# Patient Record
Sex: Female | Born: 1980 | Race: White | Hispanic: No | Marital: Married | Smoking: Never smoker
Health system: Southern US, Community
[De-identification: ages and names within clinical notes are randomized; demographics above are authoritative.]

## PROBLEM LIST (undated history)

## (undated) DIAGNOSIS — N649 Disorder of breast, unspecified: Secondary | ICD-10-CM

## (undated) DIAGNOSIS — Z148 Genetic carrier of other disease: Secondary | ICD-10-CM

## (undated) HISTORY — DX: Genetic carrier of other disease: Z14.8

## (undated) HISTORY — DX: Disorder of breast, unspecified: N64.9

---

## 2002-06-19 ENCOUNTER — Other Ambulatory Visit: Admission: RE | Admit: 2002-06-19 | Discharge: 2002-06-19 | Payer: Self-pay | Admitting: Obstetrics and Gynecology

## 2003-06-24 ENCOUNTER — Other Ambulatory Visit: Admission: RE | Admit: 2003-06-24 | Discharge: 2003-06-24 | Payer: Self-pay | Admitting: Obstetrics and Gynecology

## 2004-08-08 ENCOUNTER — Other Ambulatory Visit: Admission: RE | Admit: 2004-08-08 | Discharge: 2004-08-08 | Payer: Self-pay | Admitting: Obstetrics and Gynecology

## 2005-08-16 ENCOUNTER — Other Ambulatory Visit: Admission: RE | Admit: 2005-08-16 | Discharge: 2005-08-16 | Payer: Self-pay | Admitting: Obstetrics and Gynecology

## 2009-12-24 HISTORY — PX: DILATION AND CURETTAGE OF UTERUS: SHX78

## 2010-07-05 ENCOUNTER — Ambulatory Visit (HOSPITAL_COMMUNITY): Admission: RE | Admit: 2010-07-05 | Discharge: 2010-07-05 | Payer: Self-pay | Admitting: Obstetrics & Gynecology

## 2010-08-16 ENCOUNTER — Ambulatory Visit (HOSPITAL_COMMUNITY)
Admission: RE | Admit: 2010-08-16 | Discharge: 2010-08-16 | Payer: Self-pay | Source: Home / Self Care | Attending: Obstetrics | Admitting: Obstetrics

## 2010-09-07 ENCOUNTER — Encounter
Admission: RE | Admit: 2010-09-07 | Discharge: 2010-09-27 | Payer: Self-pay | Source: Home / Self Care | Attending: Obstetrics | Admitting: Obstetrics

## 2010-09-18 ENCOUNTER — Encounter: Payer: Self-pay | Admitting: Obstetrics

## 2010-10-08 ENCOUNTER — Inpatient Hospital Stay (HOSPITAL_COMMUNITY)
Admission: AD | Admit: 2010-10-08 | Discharge: 2010-10-11 | DRG: 765 | Disposition: A | Discharge status: Home / Self Care | Payer: PRIVATE HEALTH INSURANCE | Source: Ambulatory Visit | Attending: Obstetrics | Admitting: Obstetrics

## 2010-10-08 ENCOUNTER — Inpatient Hospital Stay (HOSPITAL_COMMUNITY): Payer: PRIVATE HEALTH INSURANCE

## 2010-10-08 ENCOUNTER — Other Ambulatory Visit: Payer: Self-pay | Admitting: Obstetrics

## 2010-10-08 DIAGNOSIS — O1414 Severe pre-eclampsia complicating childbirth: Principal | ICD-10-CM | POA: Diagnosis present

## 2010-10-08 DIAGNOSIS — O34219 Maternal care for unspecified type scar from previous cesarean delivery: Secondary | ICD-10-CM | POA: Diagnosis present

## 2010-10-08 DIAGNOSIS — O99814 Abnormal glucose complicating childbirth: Secondary | ICD-10-CM | POA: Diagnosis present

## 2010-10-08 DIAGNOSIS — O4590 Premature separation of placenta, unspecified, unspecified trimester: Secondary | ICD-10-CM | POA: Diagnosis present

## 2010-10-08 LAB — CBC
HCT: 36.4 % (ref 36.0–46.0)
Hemoglobin: 12.4 g/dL (ref 12.0–15.0)
RBC: 4.1 MIL/uL (ref 3.87–5.11)
WBC: 13 10*3/uL — ABNORMAL HIGH (ref 4.0–10.5)

## 2010-10-08 LAB — PROTEIN / CREATININE RATIO, URINE
Creatinine, Urine: 16.7 mg/dL
Total Protein, Urine: <6 mg/dL

## 2010-10-08 LAB — LACTATE DEHYDROGENASE: LDH: 200 U/L (ref 94–250)

## 2010-10-08 LAB — COMPREHENSIVE METABOLIC PANEL
Alkaline Phosphatase: 112 U/L (ref 39–117)
BUN: 11 mg/dL (ref 6–23)
Calcium: 8.6 mg/dL (ref 8.4–10.5)
Glucose, Bld: 155 mg/dL — ABNORMAL HIGH (ref 70–99)
Potassium: 3.7 mEq/L (ref 3.5–5.1)
Total Protein: 5.9 g/dL — ABNORMAL LOW (ref 6.0–8.3)

## 2010-10-08 LAB — URIC ACID: Uric Acid, Serum: 6.8 mg/dL (ref 2.4–7.0)

## 2010-10-09 LAB — COMPREHENSIVE METABOLIC PANEL
Albumin: 2 g/dL — ABNORMAL LOW (ref 3.5–5.2)
Alkaline Phosphatase: 79 U/L (ref 39–117)
BUN: 6 mg/dL (ref 6–23)
CO2: 21 mEq/L (ref 19–32)
Chloride: 106 mEq/L (ref 96–112)
GFR calc non Af Amer: >60 mL/min (ref >60)
Glucose, Bld: 92 mg/dL (ref 70–99)
Potassium: 3.5 mEq/L (ref 3.5–5.1)
Total Bilirubin: 0.6 mg/dL (ref 0.3–1.2)

## 2010-10-09 LAB — CBC
HCT: 31.5 % — ABNORMAL LOW (ref 36.0–46.0)
Hemoglobin: 10.7 g/dL — ABNORMAL LOW (ref 12.0–15.0)
MCH: 30.3 pg (ref 26.0–34.0)
MCV: 89.2 fL (ref 78.0–100.0)
RBC: 3.53 MIL/uL — ABNORMAL LOW (ref 3.87–5.11)
WBC: 10.9 10*3/uL — ABNORMAL HIGH (ref 4.0–10.5)

## 2010-10-09 LAB — DIC (DISSEMINATED INTRAVASCULAR COAGULATION)PANEL
Fibrinogen: 552 mg/dL — ABNORMAL HIGH (ref 204–475)
INR: 0.99 (ref 0.00–1.49)
Platelets: 97 10*3/uL — ABNORMAL LOW (ref 150–400)
Prothrombin Time: 13.3 seconds (ref 11.6–15.2)

## 2010-10-09 LAB — MAGNESIUM: Magnesium: 4 mg/dL — ABNORMAL HIGH (ref 1.5–2.5)

## 2010-10-09 LAB — URIC ACID: Uric Acid, Serum: 6.5 mg/dL (ref 2.4–7.0)

## 2010-10-10 LAB — CBC
Hemoglobin: 10.3 g/dL — ABNORMAL LOW (ref 12.0–15.0)
MCH: 30.6 pg (ref 26.0–34.0)
MCHC: 33.9 g/dL (ref 30.0–36.0)
RDW: 12.9 % (ref 11.5–15.5)

## 2010-10-11 LAB — TYPE AND SCREEN

## 2010-11-04 ENCOUNTER — Ambulatory Visit (HOSPITAL_COMMUNITY)
Admission: RE | Admit: 2010-11-04 | Discharge: 2010-11-04 | Disposition: A | Discharge status: Home / Self Care | Payer: PRIVATE HEALTH INSURANCE | Source: Ambulatory Visit | Attending: Obstetrics | Admitting: Obstetrics

## 2010-11-04 DIAGNOSIS — O9279 Other disorders of lactation: Secondary | ICD-10-CM | POA: Insufficient documentation

## 2010-11-04 NOTE — Op Note (Signed)
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  POSTOPERATIVE DIAGNOSES:  A 35 weeks intrauterine pregnancy hemolysis, elevated liver enzymes, low platelets syndrome suspected placental abruption, prior cesarean section, plus confirmed placental abruption.  PROCEDURE:  Repeat cesarean section.  SURGEON:  Lendon Colonel, MD  ASSISTANT:  Arlan Organ, MD  ANESTHESIA:  Spinal.  FINDINGS:  Normal uterus, tubes and abruption of approximately one-sixth of the placental surface with a large retroplacental clot.  Evidence of placental abruption in the right cornual region.  Female infant in the DOA position, weight 5 pounds 13 ounces.  Apgars 9 and 9.  The cord pH was 7.24.  SPECIMENS:  Cord pH and placenta to Pathology.  ANTIBIOTICS:  1 g Ancef.  ESTIMATED BLOOD LOSS:  800 mL.  COMPLICATIONS:  None.  INDICATIONS:  This is a 30 year old G3, P0-1-1 at 35 weeks and 4 days who presents with vaginal bleeding and general feeling of "not right" and was found to have thrombocytopenia, elevated liver enzymes and moderate amount of vaginal bleeding with contractions.  Discussion was had with the patient and consulting maternal fetal medicine specialist who agreed to proceed with repeat cesarean section at this time.  DESCRIPTION OF PROCEDURE:  After informed consent was obtained, the patient was taken to the operating room where spinal anesthesia was initially without difficulty.  She was prepped and draped in the  normal sterile fashion in dorsal supine position with a leftward tilt.  A Pfannenstiel skin was made with the scalpel over the old Pfannenstiel scar.  This was carried through to the underlying layer of fascia with Bovie cautery.  There was quite a bit of scar in the subcu tissue.  The fascia was finally found and transected with the combination of the Mayo scissors and the Bovie cautery.  Please note, there was some muscle tissue in the fascia.  There was also some peritoneal tissue protruding through the fascia.  This was carefully dissected around.  The peritoneum was identified and carefully entered.  Peritoneal incision was extended superiorly and inferiorly with good visualization of bladder.  Additional fascial dissection was done both sharply and with the Bovie cautery.  Several bleeding vessels were cauterized with Bovie. Bladder blade was inserted.  The vesicouterine peritoneum was identified, grasped with pickups, entered sharply.  The bladder flap was extended sharply.  Bladder blade was replaced after palpation of the uterus, identification of the major vessels.  Lower uterine segment was incised in transverse fashion with the scalpel.  Once reaching the amniotic sac, the bandage scissors used to extend the uterine incision laterally and superiorly.  The amniotic sac was ruptured, the infant's occiput was grasped, brought to the incision.  The infant was delivered without complication.  Nose and mouth were bulb suctioned.  The remainder of the infant was delivered.  Cord was clamped and cut.  The infant was handed off to awaiting pediatrician.  Cord gas and cord blood were obtained.  The placenta was expressed. At this point bruising and evidence of abruption were noted on the uterus and a large retroplacental clot was noted.  Uterus was exteriorized, cleared of all clots and debris.  Uterine incision was repaired with 0 Vicryl in running locked fashion.  A second layer of same  suture was used in imbricating fashion.  Additional figure-of-eight was placed in the midpoint of the incision.  The uterus was returned to the abdomen. The incision was found to be hemostatic.  The gutters were cleared of all clots and debris.  The cut muscle edges and the underside of fascia were inspected.  There was a small amount of bleeding at the edge of the bladder.  This was controlled with very cautious cautery.  Given the amount of scar on the entry into the abdomen.  The bladder was back filled to about 300 mL with sterile milk.  All edges of bladder were seen and found to be well defined.  No breaks and bladder integrity was noted.  Bladder was redrained.  The peritoneum was closed with 2-0 Vicryl in a running fashion.  The fascia was closed with 0 Vicryl in a running fashion.  Scarpa's layer was closed and the skin was closed with staples.  The patient tolerated procedure well.  Sponge, lap, and needle counts were correct x3.  The patient was taken to recovery room in stable condition.     Lendon Colonel, MD     KAF/MEDQ  D:  10/08/2010  T:  10/09/2010  Job:  [id number]  Electronically Signed by Noland Fordyce MD on 11/03/2010 07:34:14 PM

## 2010-11-08 ENCOUNTER — Inpatient Hospital Stay (HOSPITAL_COMMUNITY): Admission: AD | Admit: 2010-11-08 | Payer: Self-pay | Admitting: Obstetrics and Gynecology

## 2010-11-22 ENCOUNTER — Other Ambulatory Visit: Payer: Self-pay | Admitting: Obstetrics

## 2010-12-16 NOTE — Discharge Summary (Signed)
Tiffany Fleming, Tiffany Fleming            ACCOUNT NO.:  [id number]  MEDICAL RECORD NO.:  [id number]           PATIENT TYPE:  I  LOCATION:  9310                          [facility]ILITY:  [facility]  PHYSICIAN:  Lendon Colonel, MD   DATE OF BIRTH:  Jun 20, 1981  DATE OF ADMISSION:  10/08/2010 DATE OF DISCHARGE:  10/11/2010                              DISCHARGE SUMMARY   ADMITTING DIAGNOSES:  35 weeks' gestation, elevated blood pressure, vaginal bleeding concern for placental abruption, previous cesarean section.  DISCHARGE DIAGNOSES:  Postoperative day #3, status post cesarean section, preeclampsia with HELLP syndrome marginal placental abruption.  PATIENT HISTORY:  The patient is a gravida 3, para 0-1-1-1, at 35 weeks and 4 days with [facility] at November 08, 2010.  Prenatal care at [facility], at [redacted] weeks gestation,  Dr. Ernestina Penna.Marland Kitchen  PRENATAL LABORATORY DATA:  Blood type and Rh O positive, rubella positive, HIV negative, RPR negative, hepatitis B negative.  GTT 3-hour abnormal with diagnosis of gestational diabetes.  Prenatal course was complicated with a history of cesarean section, history of preeclampsia, current gestational diabetes A2, on glyburide and a G mutation, MTHFR.  MEDICAL/SURGICAL HISTORY:  Significant for C-section x1 and TAB with Trisomy 21.  CURRENT MEDICATIONS: 1. Prenatal vitamin 1 tablet daily. 2. Folic acid 1 mg p.o. daily. 3. Baby aspirin. 4. Glyburide 2.5 mg p.o. at supper.  PRESENTATION:  The patient presents with onset of vaginal bleeding and general malaise.  Blood pressure at the time of admission is 155/85.  Admission CBC white blood cell count 13, hemoglobin 12.4, hematocrit 36.1, and platelet count of 103.  D-dimer is positive at 2.1 with fibronigen 552. SGOT is 36, SGPT of 35, and uric acid of 6.8.  PHYSICAL EXAMINATION:  Cervical exam:  Patient is a fingertip, thick and high with  noted dark red vaginal bleeding on exam.  On sonogram, vertex presentation.   AFI 13.  Estimated fetal weight at 67 percentile with a reactive NST.  CONSULTATIONS:  MFM, Dr. Sherrie George with recommendation for cesarean delivery and magnesium sulfate prophylaxis.  PROCEDURE:  The patient will underwent cesarean section by Dr. Ernestina Penna. Female newborn weight 5pounds 13ounces transfered to NICU. Placental abruption confirmed intraoperatively. See operative report for further details.  POSTOPERATIVE COURSE:  The patient's postoperative course was uneventful, improving HELLP syndrome and resolving preeclampsia. Magnesium sulfate prophylaxis x24 hours postop with positive diuresis on postoperative day #2.  At the time of discharge, patient is net negative 300, blood pressure range 103-120/71-84.  Postoperative CBC on October 10, 2010, white blood cell count 10.2, hemoglobin 10.3, hematocrit 30.4, and platelet count of 93.  Repeat labs revealed stable platelet count at time of discharge.  The patient is discharged home on postoperative day #3.  Physical exam was within normal limits.  The patient was afebrile during the course of hospitalization.  Wound edges were well approximated with staples.  No erythema, no ecchymosis, no drainage.  DISCHARGE CARE:  The patient is discharged home, to return to [facility] in 4 days for staple removal and blood pressure check.  PIH precautions were reviewed with the patient with instructions to call.  DIET:  Regular.  ACTIVITY:  Postoperative restrictions x2 weeks.  POSTPARTUM INSTRUCTIONS:  Per WOB booklet.  MEDICATIONS: 1. Prenatal vitamin 1 tablet p.o. daily. 2. Ibuprofen 800 mg every 8 hours as needed for discomfort. 3. Percocet 1-2 tablets every 4 hours as needed for pain.     Marlinda Mike, C.N.M.   ______________________________ Lendon Colonel, MD    TB/MEDQ  D:  11/13/2010  T:  11/14/2010  Job:  [id number]  Electronically Signed by Marlinda Mike C.N.M. on 11/16/2010 12:36:35 PM Electronically Signed by Noland Fordyce MD on 12/16/2010 07:03:42 AM

## 2012-03-15 IMAGING — US US OB DETAIL+14 WK
1 series · 18 of 28 positions shown · non-contrast
Comparison: none

OBSTETRICAL ULTRASOUND:
 This ultrasound was performed in The [HOSPITAL], and the [facility] report will be stored to [REDACTED] PACS.  This report is also available in [HOSPITAL]?s accessANYware.

[Series 1: us ob detail+14 wk · 18 of 109 slices shown]
[im 1/109]
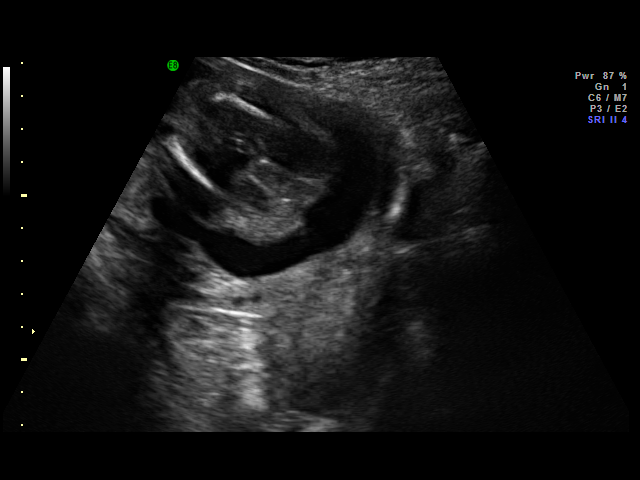
[im 9/109]
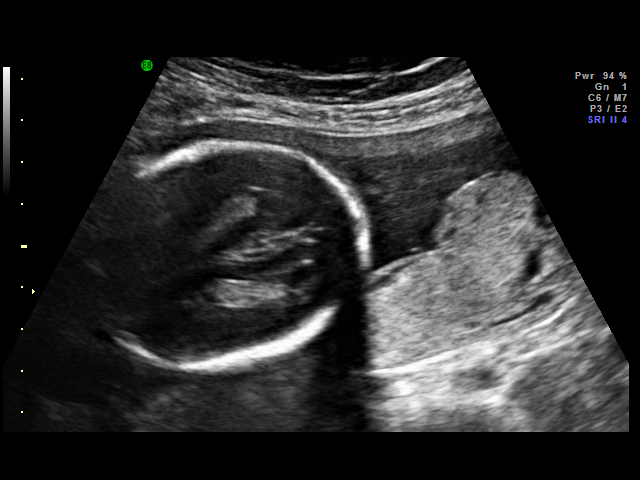
[im 13/109]
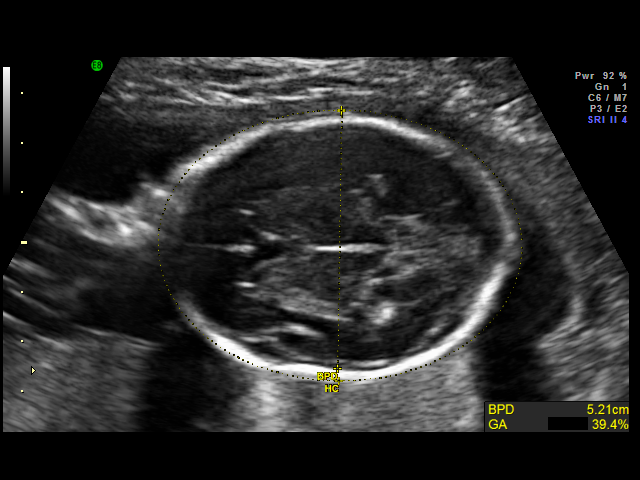
[im 21/109]
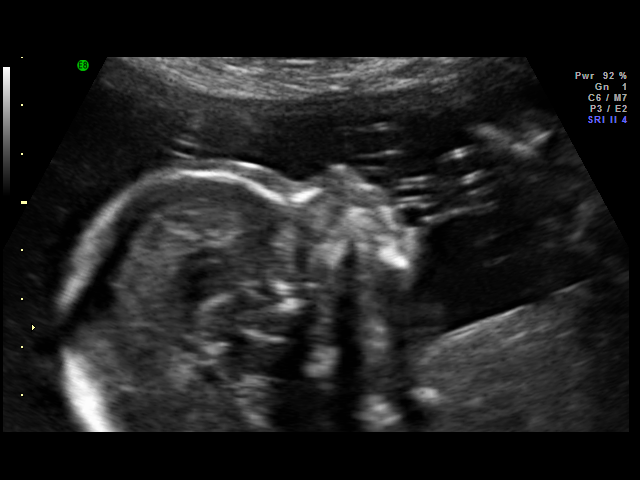
[im 29/109]
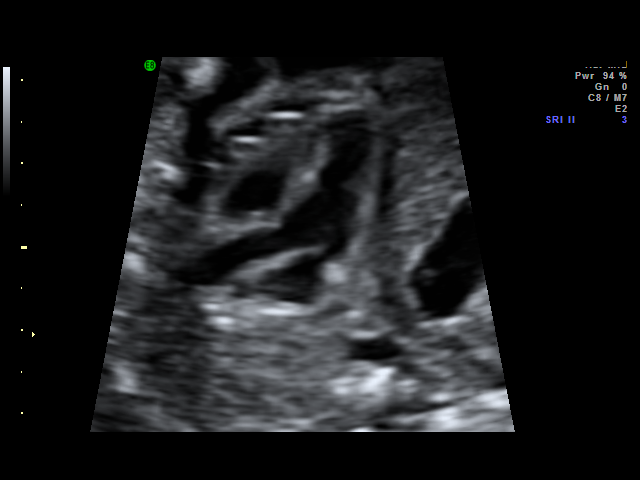
[im 33/109]
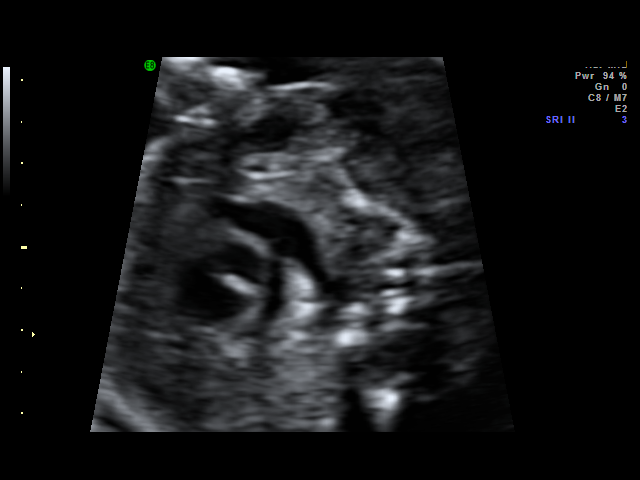
[im 41/109]
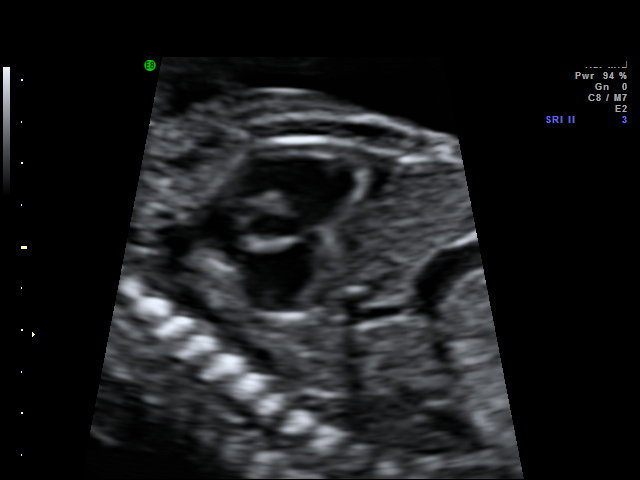
[im 45/109]
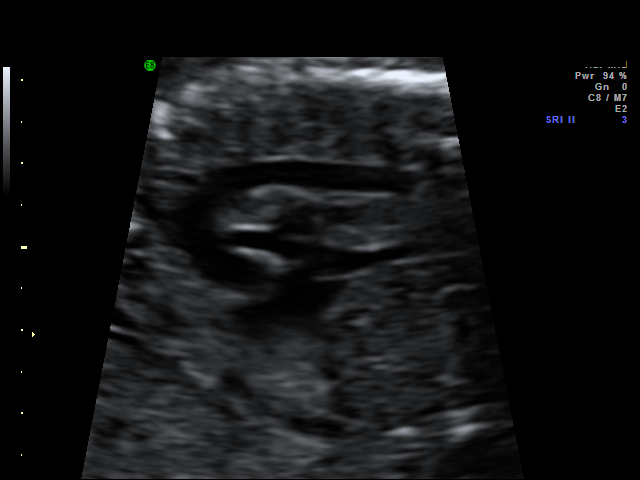
[im 53/109]
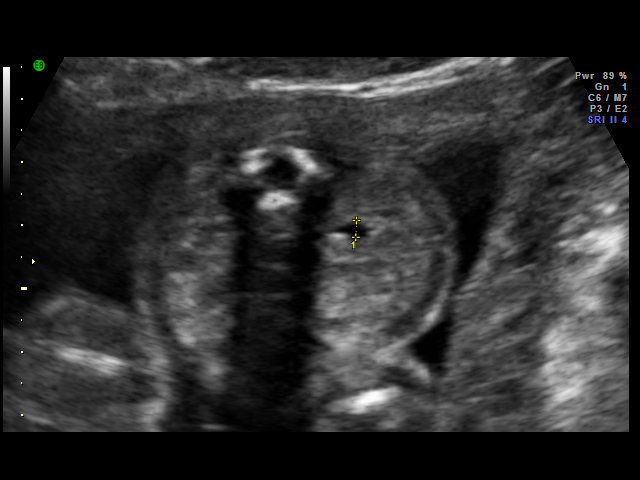
[im 57/109]
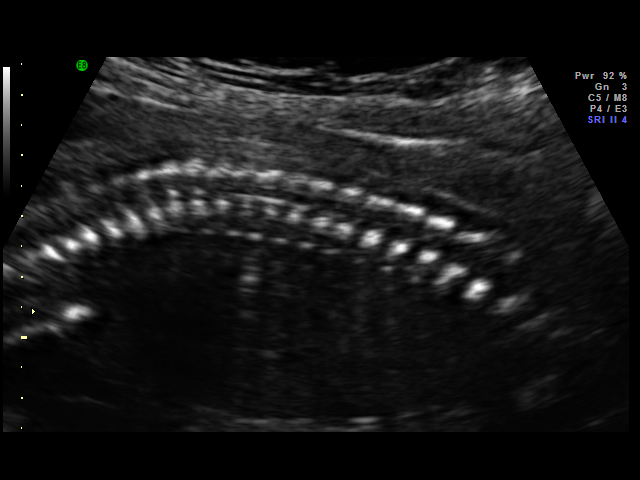
[im 65/109]
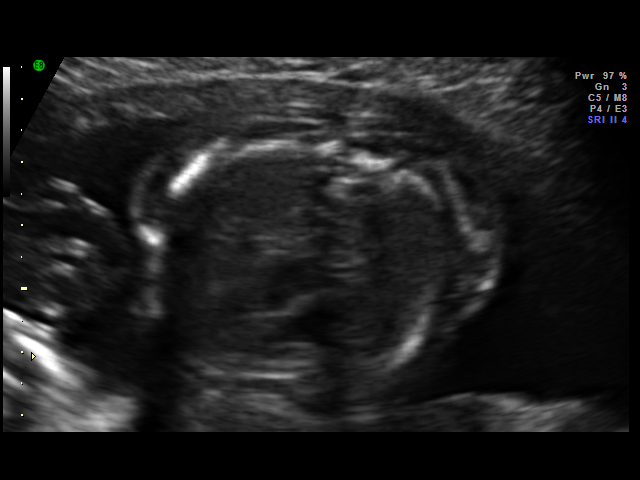
[im 69/109]
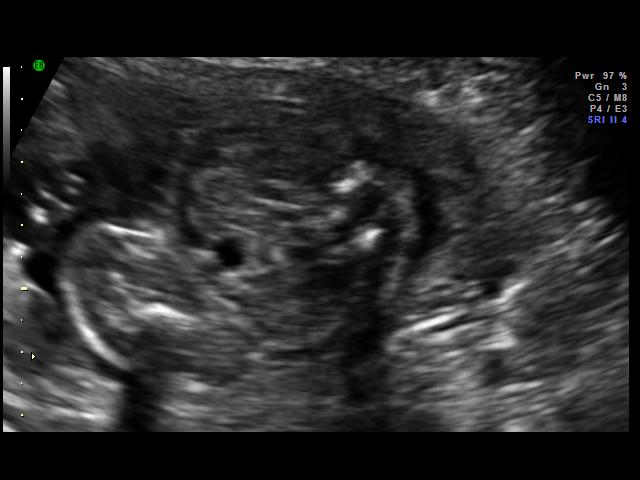
[im 77/109]
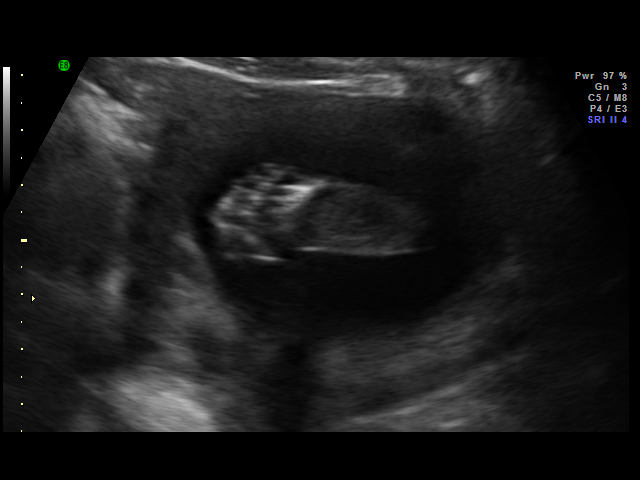
[im 85/109]
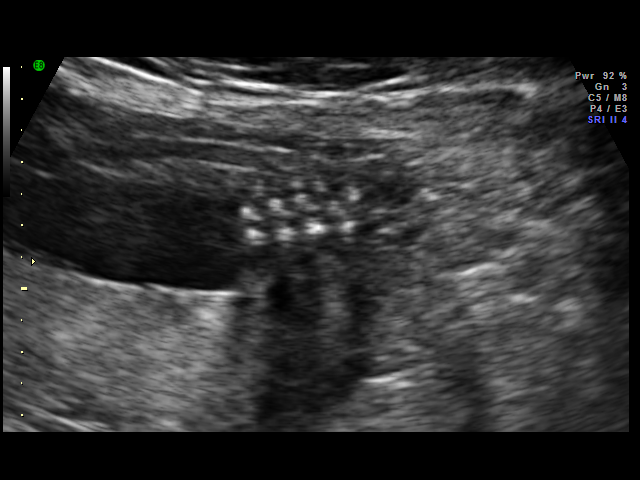
[im 89/109]
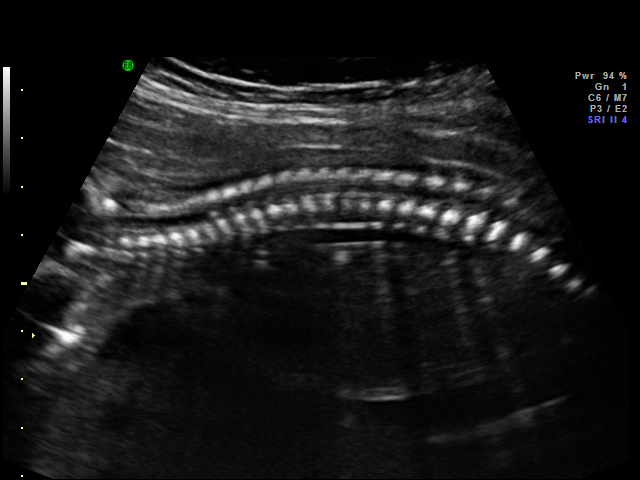
[im 97/109]
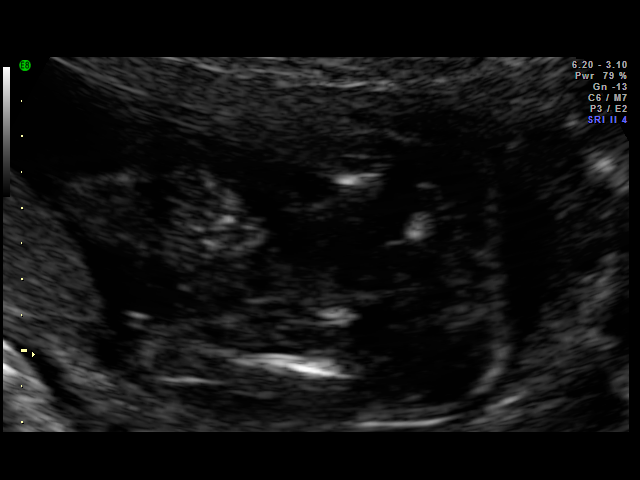
[im 101/109]
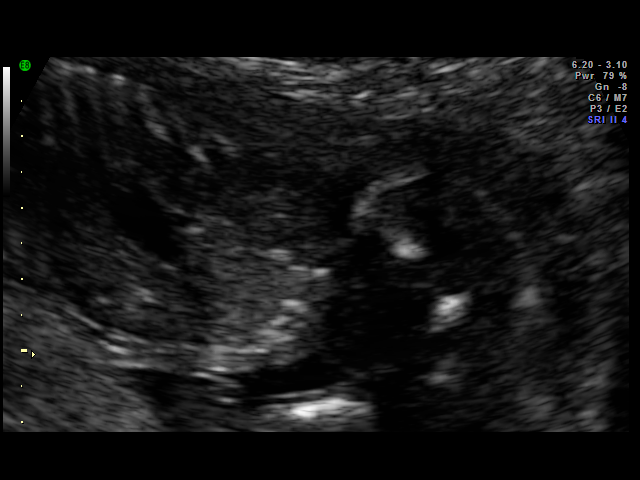
[im 109/109]
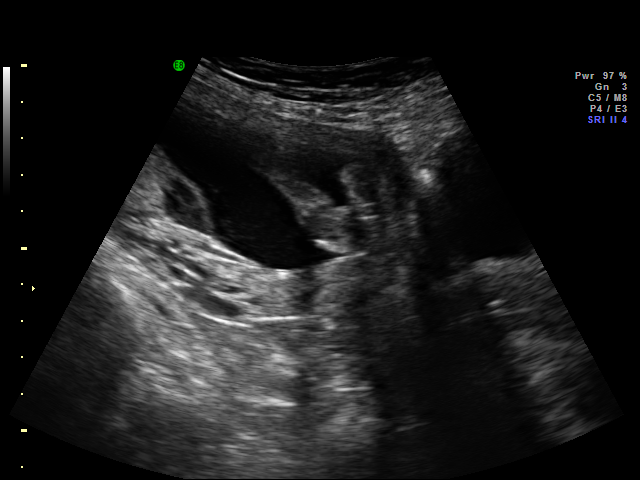

[18 of 28 positions shown; findings below may reference images not displayed]

IMPRESSION: [facility] has also been faxed to the ordering physician.

## 2012-04-26 IMAGING — US US OB FOLLOW-UP
1 series · 18 of 28 positions shown · non-contrast
Comparison: none

[Series 1: us ob follow-up · 18 of 40 slices shown]
[im 1/40]
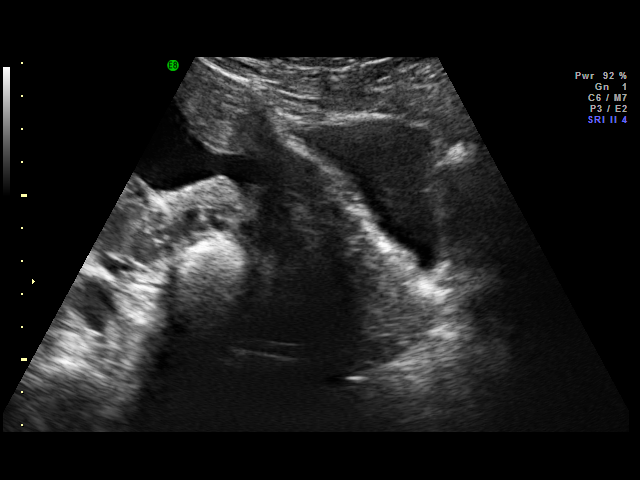
[im 3/40]
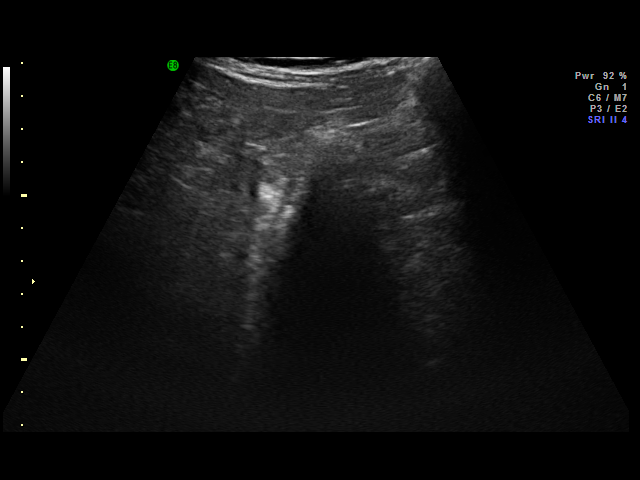
[im 5/40]
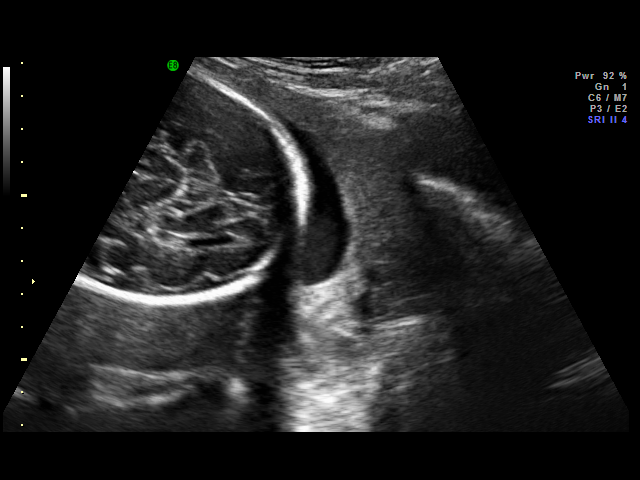
[im 8/40]
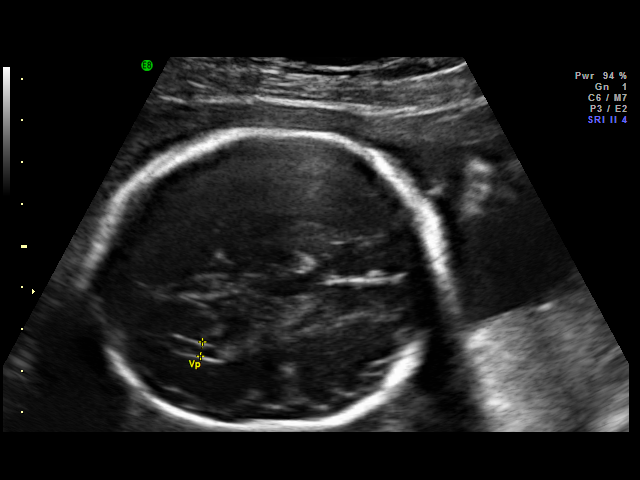
[im 11/40]
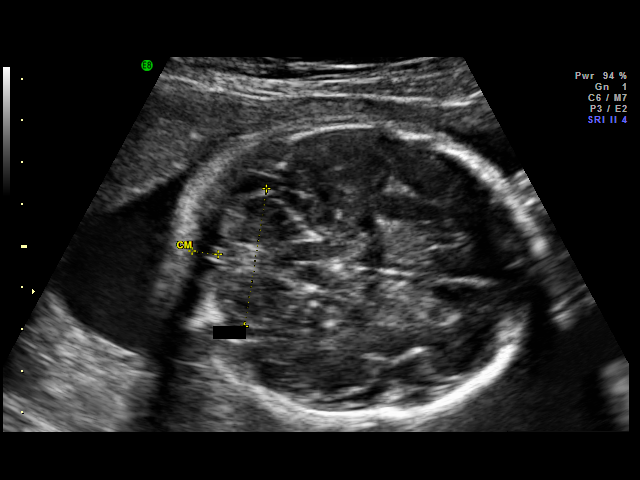
[im 12/40]
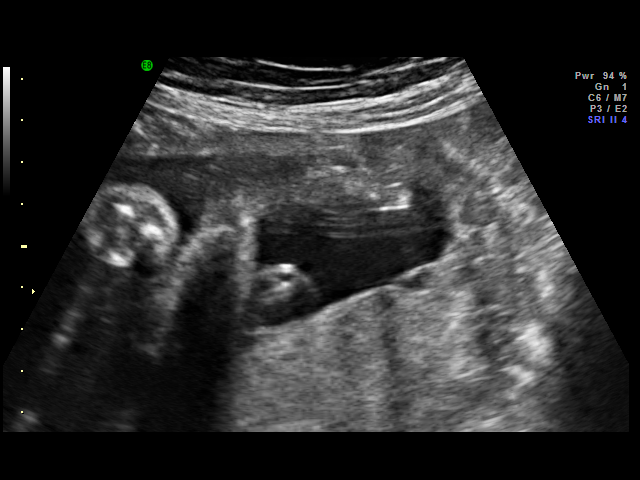
[im 15/40]
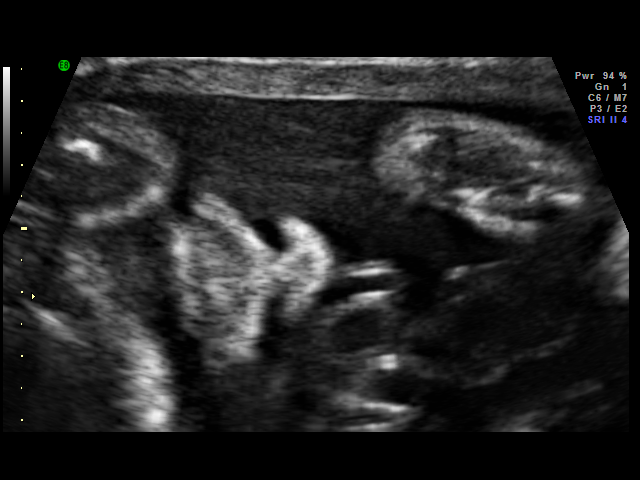
[im 16/40]
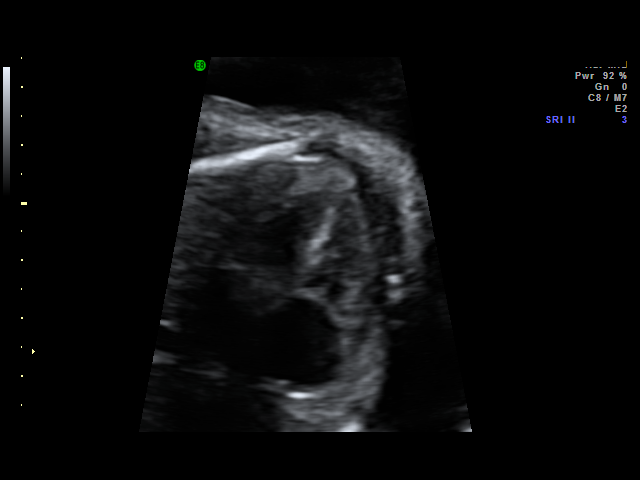
[im 19/40]
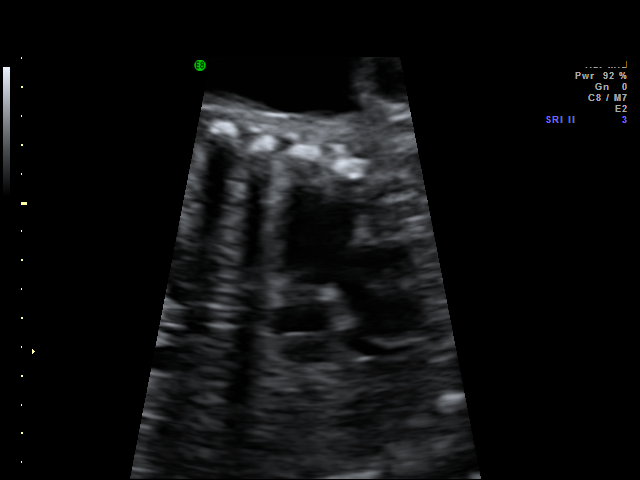
[im 21/40]
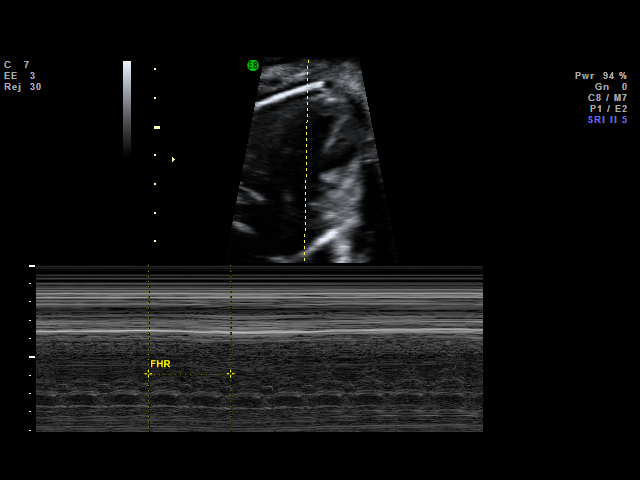
[im 24/40]
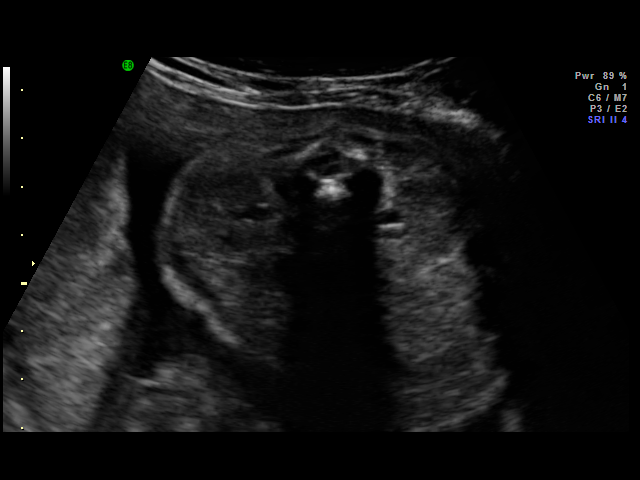
[im 25/40]
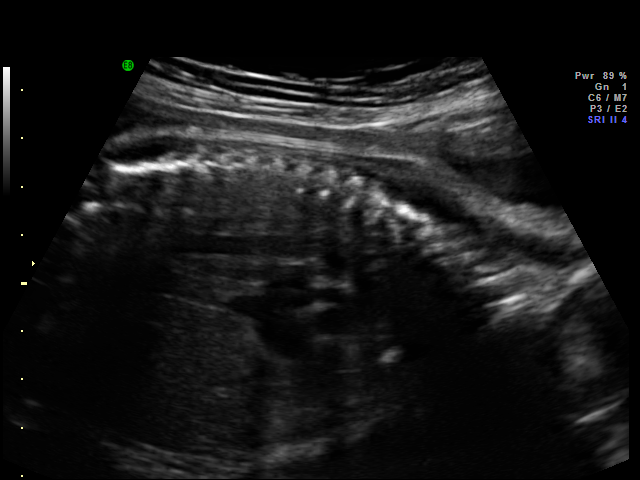
[im 28/40]
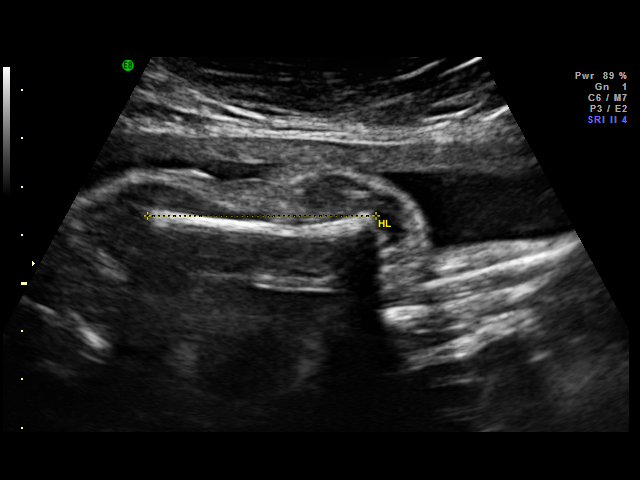
[im 31/40]
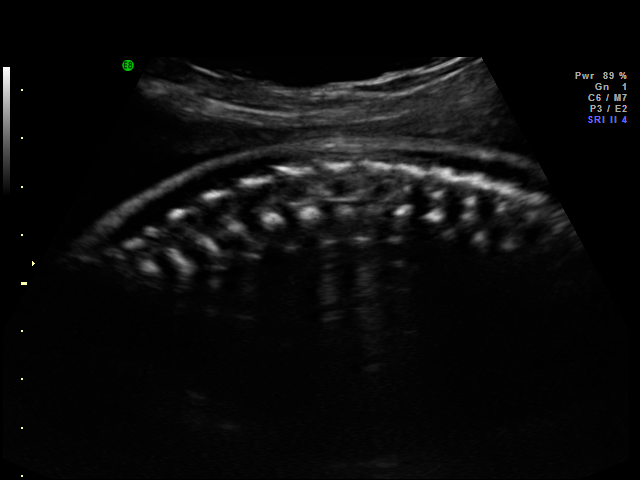
[im 32/40]
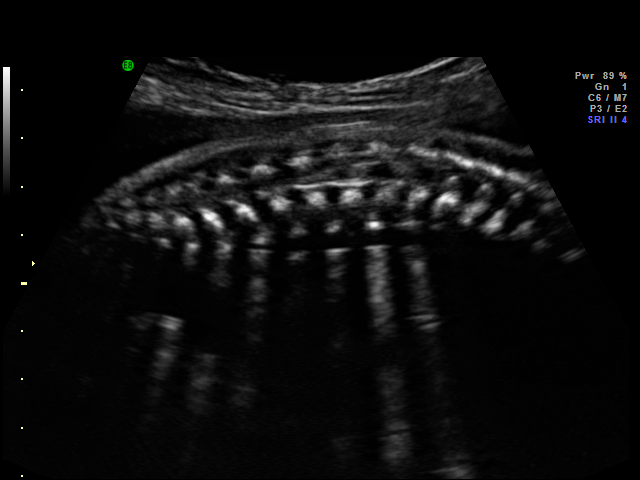
[im 35/40]
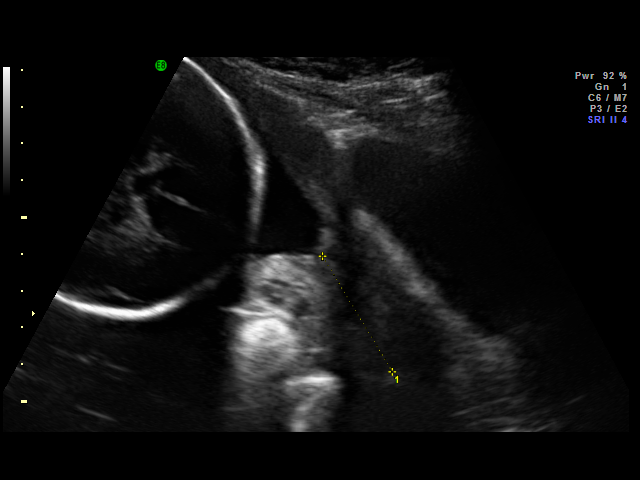
[im 37/40]
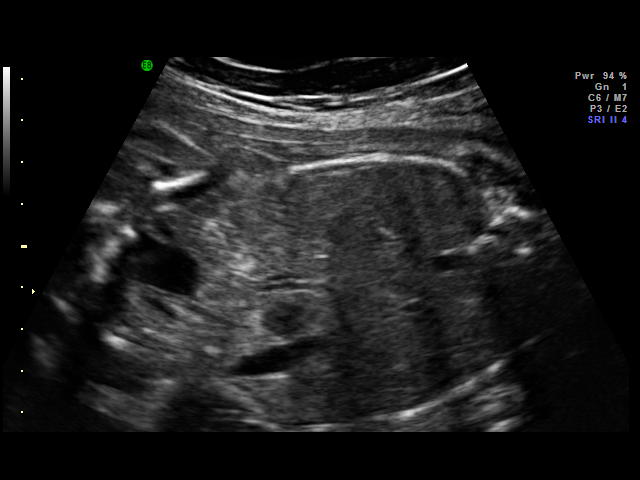
[im 40/40]
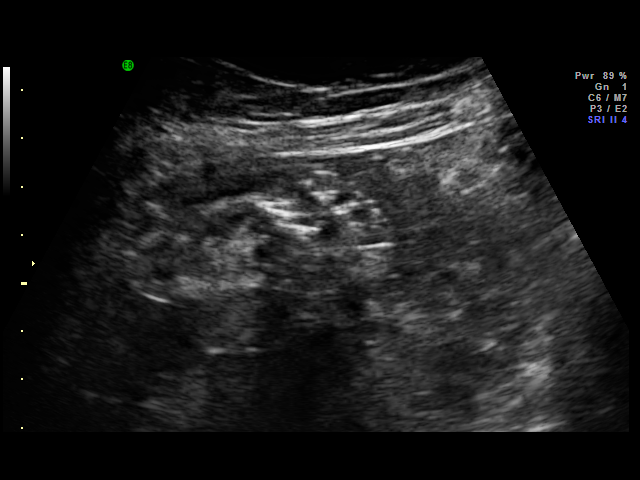

[18 of 28 positions shown; findings below may reference images not displayed]

Canned report from images found in remote index.

Refer to host system for actual result text.

## 2012-06-18 IMAGING — US US OB FOLLOW-UP
1 series · 12 of 28 positions shown · non-contrast
Comparison: none

[Series 1: us ob comp +14 wk · 12 of 39 slices shown]
[im 2/39]
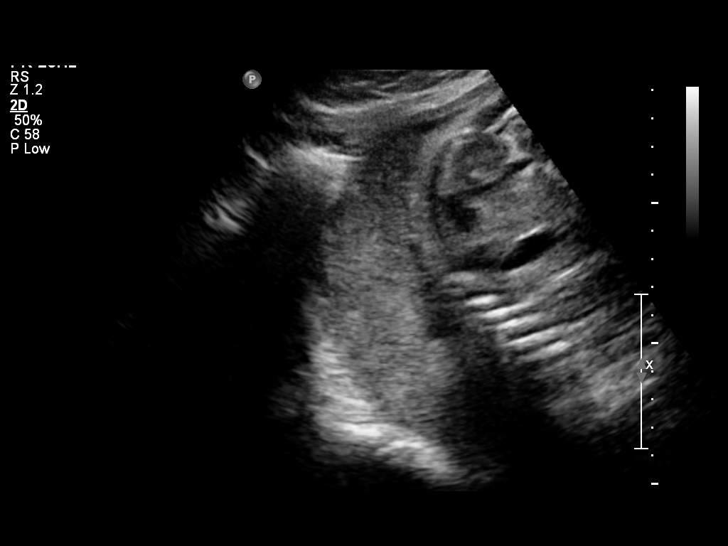
[im 5/39]
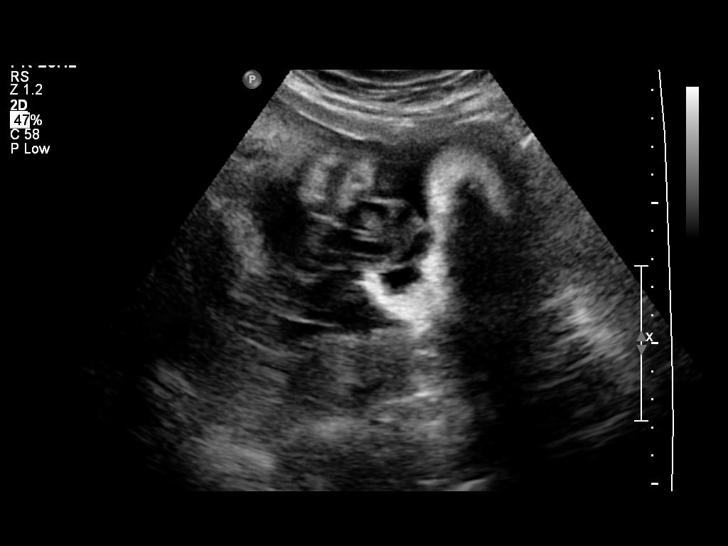
[im 8/39]
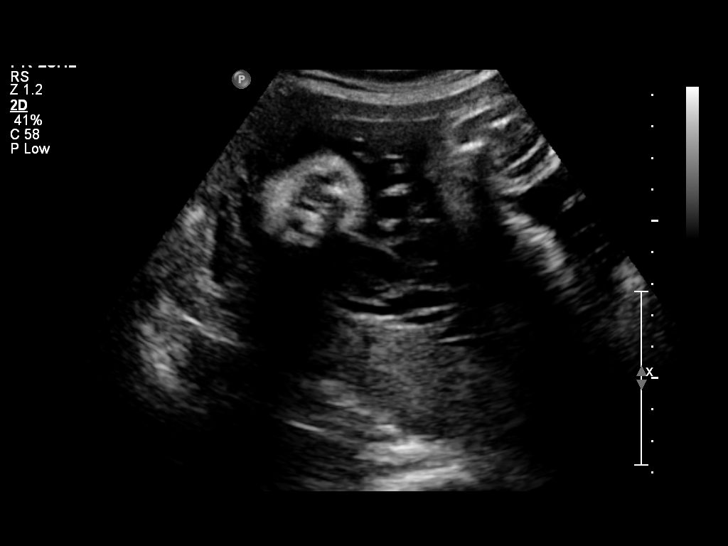
[im 12/39]
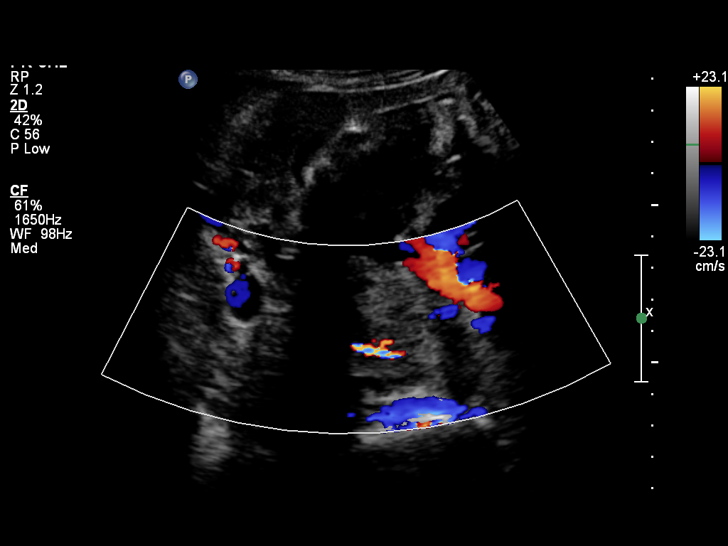
[im 15/39]
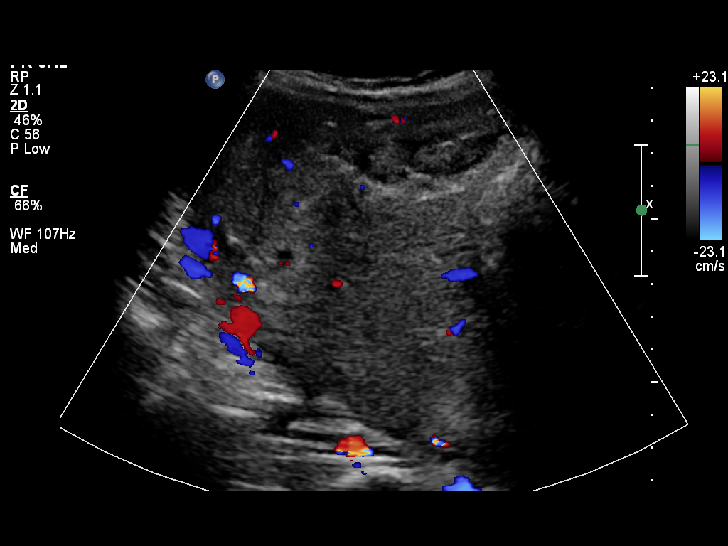
[im 17/39]
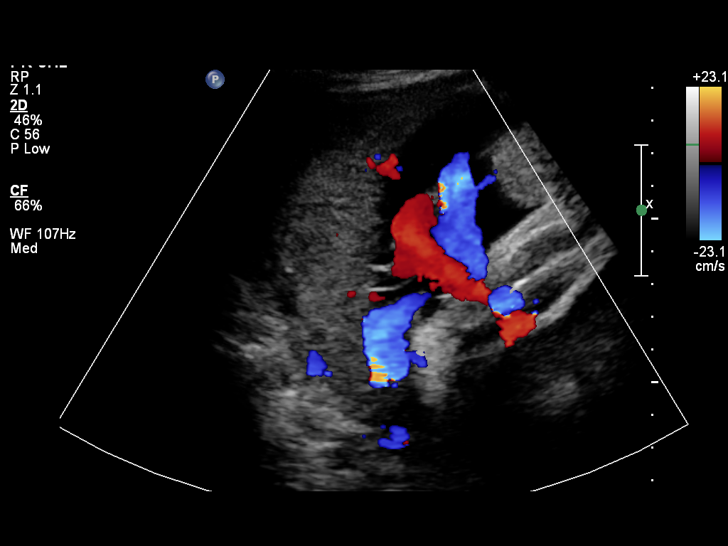
[im 22/39]
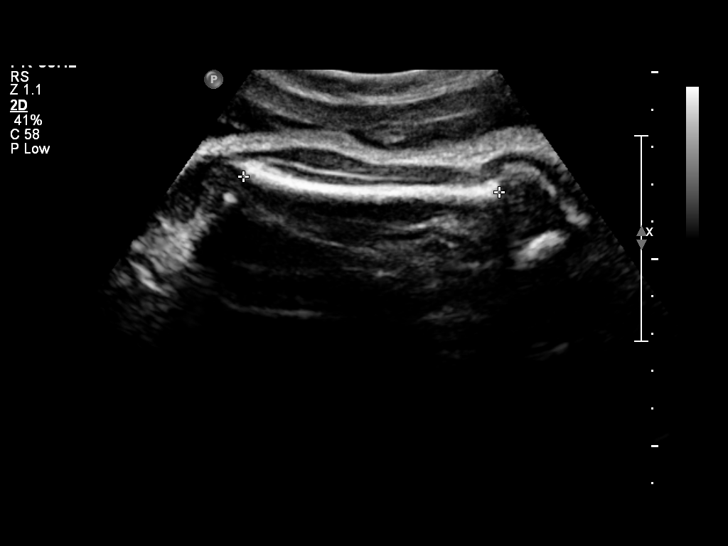
[im 24/39]
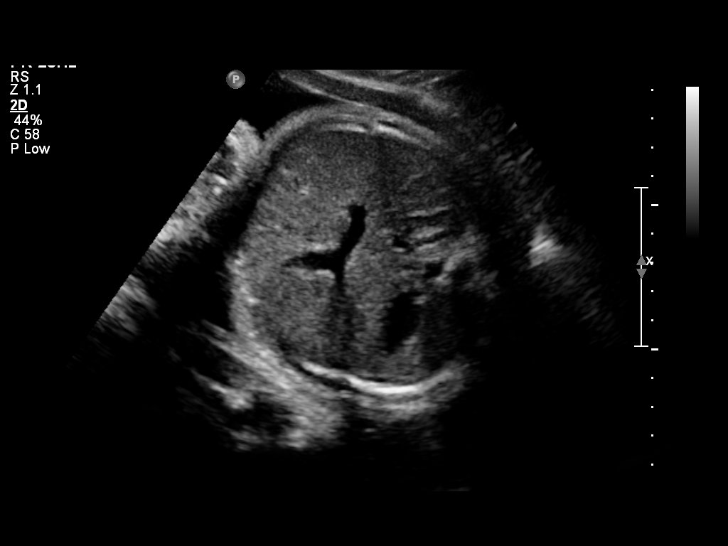
[im 27/39]
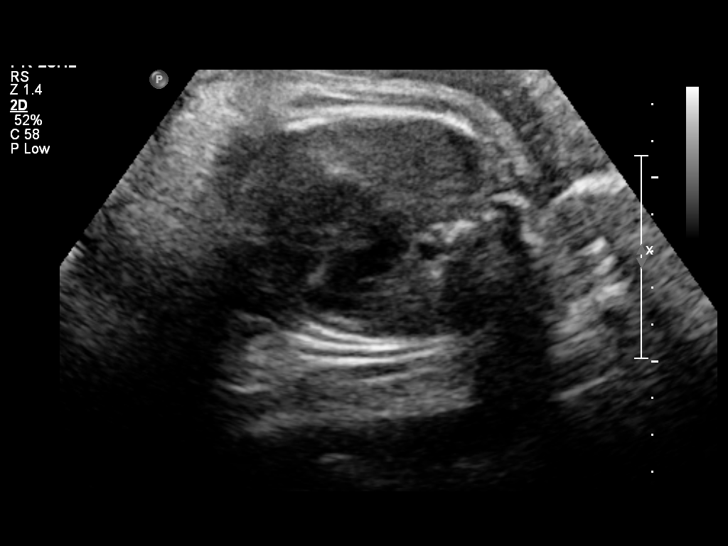
[im 31/39]
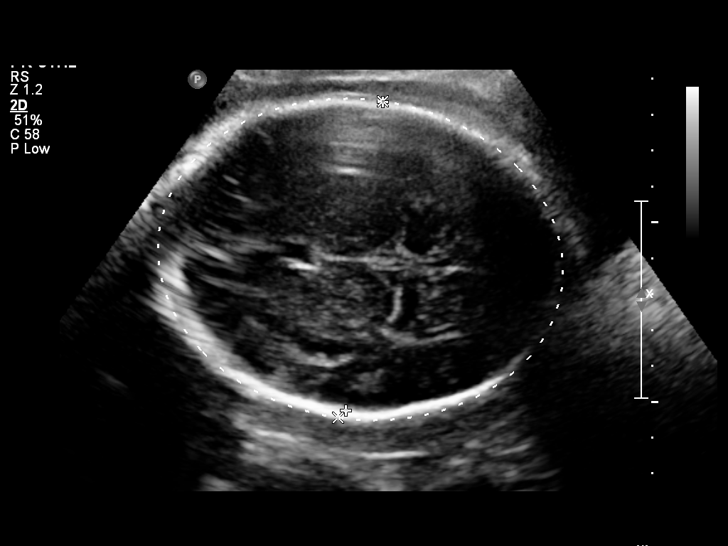
[im 34/39]
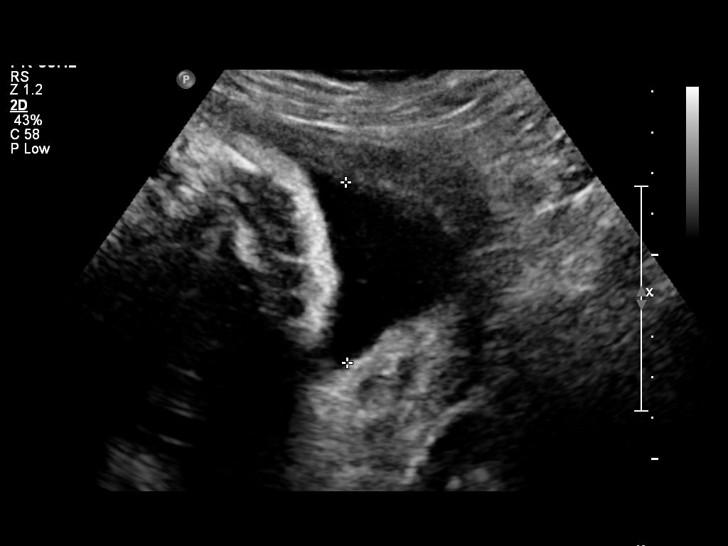
[im 37/39]
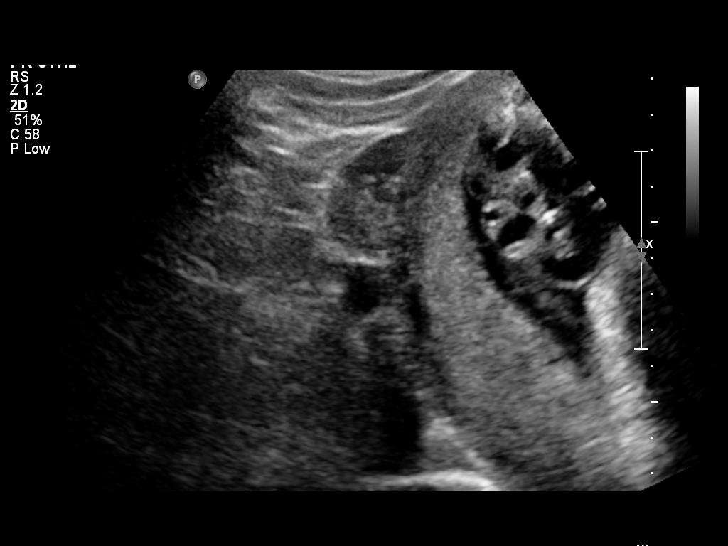

[12 of 28 positions shown; findings below may reference images not displayed]

OBSTETRICS REPORT
                      (Signed Final 10/08/2010 [DATE])

Procedures

 US OB FOLLOW UP                                       76816.1
Indications

 Prior history of preeclampsia to require delivery at
 34 weeks gestaiton
 Prior cesarean section
 Ecogenic fetal bowel
 Vaginal bleeding, unknown etiology
 Assess Fetal Growth / Estimated Fetal Weight
 Assess placenta
Fetal Evaluation

 Fetal Heart Rate:  155                          bpm
 Cardiac Activity:  Observed
 Presentation:      Cephalic
 Placenta:          Posterior, above cervical
                    os
 P. Cord            Previously seen
 Insertion:

 Comment:    No placental abruption or previa identified.

 Amniotic Fluid
 AFI FV:      Subjectively within normal limits
 AFI Sum:     13.62   cm       48  %Tile     Larg Pckt:    5.46  cm
 RUQ:   5.46    cm   LUQ:    3.76   cm    LLQ:   4.4     cm
Biometry

 BPD:     86.7  mm     G. Age:  35w 0d                CI:        74.23   70 - 86
                                                      FL/HC:      20.9   20.1 -

 HC:     319.5  mm     G. Age:  36w 0d       28  %    HC/AC:      0.98   0.93 -

 AC:     325.7  mm     G. Age:  36w 3d       81  %    FL/BPD:     77.2   71 - 87
 FL:      66.9  mm     G. Age:  34w 3d       18  %    FL/AC:      20.5   20 - 24

 Est. FW:    0389  gm      6 lb 1 oz     67  %
Gestational Age
 LMP:           35w 4d        Date:  02/01/10                 EDD:   11/08/10
 Clinical EDD:  35w 4d                                        EDD:   11/08/10
 U/S Today:     35w 3d                                        EDD:   11/09/10
 Best:          35w 4d     Det. By:  LMP  (02/01/10)          EDD:   11/08/10
Anatomy

 Cranium:           Appears normal      Aortic Arch:       Previously seen
 Fetal Cavum:       Appears normal      Ductal Arch:       Previously seen
 Ventricles:        Appears normal      Diaphragm:         Previously seen
 Choroid Plexus:    Previously seen     Stomach:           Appears
                                                           normal, left
                                                           sided
 Cerebellum:        Previously seen     Abdomen:           Echogenic
                                                           Bowel prev
                                                           seen
 Posterior Fossa:   Previously seen     Abdominal Wall:    Previously seen
 Nuchal Fold:       Not applicable      Cord Vessels:      Appears normal
                    (>20 wks GA)                           (3 vessel cord)
 Face:              Previously seen     Kidneys:           Appear normal
 Heart:             Previously seen     Bladder:           Appears normal
 RVOT:              Previously seen     Spine:             Previously seen
 LVOT:              Previously seen     Limbs:             Previously seen

 Other:     Parents do not wish to know sex of fetus. Heels and 5th
            digit previously visualized. Nasal bone
Cervix Uterus Adnexa

 Cervix:       Not visualized (advanced GA >34 wks)
 Left Ovary:    Not visualized.
 Right Ovary:   Not visualized.

 Adnexa:     No adnexal mass visualized.
Impression

 Single living intrauterine pregnancy in cephalic presentation.
 The estimated gestational age is 35w 4d based on LMP
 (02/01/10).
 There has been appropriate growth since prior ultrasound.
 Echogenic bowel was noted previously.
 Placenta is well above internal os, and has a normal
 appearance.

## 2012-09-25 HISTORY — PX: DILATION AND CURETTAGE OF UTERUS: SHX78

## 2017-05-21 DIAGNOSIS — Z01419 Encounter for gynecological examination (general) (routine) without abnormal findings: Secondary | ICD-10-CM | POA: Diagnosis not present

## 2017-05-21 DIAGNOSIS — Z6823 Body mass index (BMI) 23.0-23.9, adult: Secondary | ICD-10-CM | POA: Diagnosis not present

## 2017-08-10 DIAGNOSIS — L821 Other seborrheic keratosis: Secondary | ICD-10-CM | POA: Diagnosis not present

## 2017-08-10 DIAGNOSIS — L820 Inflamed seborrheic keratosis: Secondary | ICD-10-CM | POA: Diagnosis not present

## 2017-08-10 DIAGNOSIS — D485 Neoplasm of uncertain behavior of skin: Secondary | ICD-10-CM | POA: Diagnosis not present

## 2017-08-10 DIAGNOSIS — Z23 Encounter for immunization: Secondary | ICD-10-CM | POA: Diagnosis not present

## 2017-10-17 DIAGNOSIS — J0110 Acute frontal sinusitis, unspecified: Secondary | ICD-10-CM | POA: Diagnosis not present

## 2017-10-23 DIAGNOSIS — L219 Seborrheic dermatitis, unspecified: Secondary | ICD-10-CM | POA: Diagnosis not present

## 2017-10-23 DIAGNOSIS — D225 Melanocytic nevi of trunk: Secondary | ICD-10-CM | POA: Diagnosis not present

## 2017-10-23 DIAGNOSIS — L309 Dermatitis, unspecified: Secondary | ICD-10-CM | POA: Diagnosis not present

## 2017-10-23 DIAGNOSIS — Z86018 Personal history of other benign neoplasm: Secondary | ICD-10-CM | POA: Diagnosis not present

## 2017-11-28 DIAGNOSIS — Z131 Encounter for screening for diabetes mellitus: Secondary | ICD-10-CM | POA: Diagnosis not present

## 2017-11-28 DIAGNOSIS — Z0000 Encounter for general adult medical examination without abnormal findings: Secondary | ICD-10-CM | POA: Diagnosis not present

## 2017-11-28 DIAGNOSIS — Z13220 Encounter for screening for lipoid disorders: Secondary | ICD-10-CM | POA: Diagnosis not present

## 2018-02-12 DIAGNOSIS — N649 Disorder of breast, unspecified: Secondary | ICD-10-CM | POA: Diagnosis not present

## 2018-02-26 DIAGNOSIS — R922 Inconclusive mammogram: Secondary | ICD-10-CM | POA: Diagnosis not present

## 2018-02-26 DIAGNOSIS — N6002 Solitary cyst of left breast: Secondary | ICD-10-CM | POA: Diagnosis not present

## 2018-08-16 DIAGNOSIS — Z01419 Encounter for gynecological examination (general) (routine) without abnormal findings: Secondary | ICD-10-CM | POA: Diagnosis not present

## 2018-09-01 DIAGNOSIS — R300 Dysuria: Secondary | ICD-10-CM | POA: Diagnosis not present

## 2018-10-28 DIAGNOSIS — D225 Melanocytic nevi of trunk: Secondary | ICD-10-CM | POA: Diagnosis not present

## 2018-10-28 DIAGNOSIS — Z86018 Personal history of other benign neoplasm: Secondary | ICD-10-CM | POA: Diagnosis not present

## 2018-10-28 DIAGNOSIS — R2232 Localized swelling, mass and lump, left upper limb: Secondary | ICD-10-CM | POA: Diagnosis not present

## 2021-11-17 DIAGNOSIS — Z1231 Encounter for screening mammogram for malignant neoplasm of breast: Secondary | ICD-10-CM | POA: Diagnosis not present

## 2021-11-29 DIAGNOSIS — L578 Other skin changes due to chronic exposure to nonionizing radiation: Secondary | ICD-10-CM | POA: Diagnosis not present

## 2021-11-29 DIAGNOSIS — L821 Other seborrheic keratosis: Secondary | ICD-10-CM | POA: Diagnosis not present

## 2021-11-29 DIAGNOSIS — L814 Other melanin hyperpigmentation: Secondary | ICD-10-CM | POA: Diagnosis not present

## 2021-11-29 DIAGNOSIS — D225 Melanocytic nevi of trunk: Secondary | ICD-10-CM | POA: Diagnosis not present

## 2021-11-30 DIAGNOSIS — N762 Acute vulvitis: Secondary | ICD-10-CM | POA: Diagnosis not present

## 2021-11-30 DIAGNOSIS — R109 Unspecified abdominal pain: Secondary | ICD-10-CM | POA: Diagnosis not present

## 2021-12-12 DIAGNOSIS — R109 Unspecified abdominal pain: Secondary | ICD-10-CM | POA: Diagnosis not present

## 2021-12-20 DIAGNOSIS — R109 Unspecified abdominal pain: Secondary | ICD-10-CM | POA: Diagnosis not present

## 2021-12-29 DIAGNOSIS — R102 Pelvic and perineal pain: Secondary | ICD-10-CM | POA: Diagnosis not present

## 2022-06-20 DIAGNOSIS — Z13220 Encounter for screening for lipoid disorders: Secondary | ICD-10-CM | POA: Diagnosis not present

## 2022-06-20 DIAGNOSIS — Z0000 Encounter for general adult medical examination without abnormal findings: Secondary | ICD-10-CM | POA: Diagnosis not present

## 2022-06-20 DIAGNOSIS — E538 Deficiency of other specified B group vitamins: Secondary | ICD-10-CM | POA: Diagnosis not present

## 2022-06-20 DIAGNOSIS — R7989 Other specified abnormal findings of blood chemistry: Secondary | ICD-10-CM | POA: Diagnosis not present

## 2022-08-03 DIAGNOSIS — Z6823 Body mass index (BMI) 23.0-23.9, adult: Secondary | ICD-10-CM | POA: Diagnosis not present

## 2022-08-03 DIAGNOSIS — Z01419 Encounter for gynecological examination (general) (routine) without abnormal findings: Secondary | ICD-10-CM | POA: Diagnosis not present

## 2022-09-13 DIAGNOSIS — R0789 Other chest pain: Secondary | ICD-10-CM | POA: Diagnosis not present

## 2022-09-28 ENCOUNTER — Encounter: Payer: Self-pay | Admitting: Gastroenterology

## 2022-09-29 DIAGNOSIS — H5213 Myopia, bilateral: Secondary | ICD-10-CM | POA: Diagnosis not present

## 2022-09-29 DIAGNOSIS — H52223 Regular astigmatism, bilateral: Secondary | ICD-10-CM | POA: Diagnosis not present

## 2022-09-29 DIAGNOSIS — H538 Other visual disturbances: Secondary | ICD-10-CM | POA: Diagnosis not present

## 2022-10-27 ENCOUNTER — Ambulatory Visit (INDEPENDENT_AMBULATORY_CARE_PROVIDER_SITE_OTHER): Payer: BC Managed Care – PPO | Admitting: Gastroenterology

## 2022-10-27 ENCOUNTER — Encounter: Payer: Self-pay | Admitting: Gastroenterology

## 2022-10-27 VITALS — BP 100/70 | HR 80 | Ht 64.0 in | Wt 130.1 lb

## 2022-10-27 DIAGNOSIS — R059 Cough, unspecified: Secondary | ICD-10-CM

## 2022-10-27 DIAGNOSIS — J029 Acute pharyngitis, unspecified: Secondary | ICD-10-CM | POA: Diagnosis not present

## 2022-10-27 DIAGNOSIS — R0789 Other chest pain: Secondary | ICD-10-CM | POA: Diagnosis not present

## 2022-10-27 MED ORDER — PANTOPRAZOLE SODIUM 40 MG PO TBEC: 40.0000 mg | DELAYED_RELEASE_TABLET | Freq: Every day | ORAL | 2 refills | Status: AC

## 2022-10-27 NOTE — Progress Notes (Addendum)
10/27/2022 Tiffany Fleming [id number] 1981/03/24   HISTORY OF PRESENT ILLNESS: This is a 42 year old female who is new to our office.  She been referred here by Dr. Pamala Hurry for evaluation of abdominal pain.  The referral was actually sent about 11 months ago.  She says that issue has resolved since then.  She is here today with complaints of chest pain and sore throat.  She says that for a while she has been having some issues with random discomfort in her chest.  She also feels like she always has a sore throat.  About a month and a half ago she had an episode where she had 6 hours of chest pain.  She does not have any overt regurgitation or reflux per se.  She has not tried anything for the symptoms.  Says when that when the severe episode happened she had just eaten lunch which consisted of a sandwich and soda.  She actually vomited during that episode as well.  She feels like that she does often cough or clear her throat.  No dysphagia.  She reports EKG ok and they do not think that it is cardiac related.  Past Medical History:  Diagnosis Date   Carrier of genetic disorder    Disorder of breast    Past Surgical History:  Procedure Laterality Date   CESAREAN SECTION     x2   DILATION AND CURETTAGE OF UTERUS  09/25/2012   DILATION AND CURETTAGE OF UTERUS  12/24/2009    reports that she has never smoked. She has never used smokeless tobacco. She reports that she does not drink alcohol and does not use drugs. family history includes Breast cancer in her paternal aunt; CVA in her maternal grandmother; Colon polyps in her mother; Diabetes in her father; Emphysema in her paternal grandmother; Heart attack in her paternal grandfather; Hyperlipidemia in her father; Hypertension in her father; Stroke in her maternal grandfather. Allergies  Allergen Reactions   Paba Derivatives       Outpatient Encounter Medications as of 10/27/2022  Medication Sig   calcium carbonate (OS-CAL) 1250 (500  Ca) MG chewable tablet Chew 1 tablet by mouth daily.   Cholecalciferol 50 MCG (2000 UT) TABS 1 tablet Orally Once a day   cyanocobalamin (VITAMIN B12) 1000 MCG tablet Take 1,000 mcg by mouth daily.   Multiple Vitamin (MULTIVITAMIN) capsule Take 1 capsule by mouth daily.   pantoprazole (PROTONIX) 40 MG tablet Take 1 tablet (40 mg total) by mouth daily.   No facility-administered encounter medications on file as of 10/27/2022.     REVIEW OF SYSTEMS  : All other systems reviewed and negative except where noted in the History of Present Illness.   PHYSICAL EXAM: BP 100/70 (BP Location: Left Arm, Patient Position: Sitting, Cuff Size: Normal)   Pulse 80   Ht '5\' 4"'$  (1.626 m)   Wt 130 lb 2 oz (59 kg)   LMP 10/13/2022   BMI 22.34 kg/m  General: Well developed white female in no acute distress Head: Normocephalic and atraumatic Eyes:  Sclerae anicteric, conjunctiva pink. Ears: Normal auditory acuity Lungs: Clear throughout to auscultation; no W/R/R. Heart: Regular rate and rhythm Abdomen: Soft, nontender, non distended. No masses or hepatomegaly noted. Normal bowel sounds Musculoskeletal: Symmetrical with no gross deformities  Skin: No lesions on visible extremities Extremities: No edema  Neurological: Alert oriented x 4, grossly non-focal Psychological:  Alert and cooperative. Normal mood and affect  ASSESSMENT AND PLAN: *Atypical chest pain,  cough, sore throat:  ? Reflux related.  Has not tried anything for her symptoms.  Will start pantoprazole 40 mg daily.  Prescription sent to pharmacy.  Will bring her back in 4-6 weeks for reassessment and if no improvement then consider EGD.  CC:  No ref. provider found

## 2022-10-27 NOTE — Patient Instructions (Addendum)
We have sent the following medications to your pharmacy for you to pick up at your convenience: Pantoprazole 40 mg daily 30-60 minutes before breakfast.   _______________________________________________________  If your blood pressure at your visit was 140/90 or greater, please contact your primary care physician to follow up on this.  _______________________________________________________  If you are age 44 or older, your body mass index should be between 23-30. Your Body mass index is 22.34 kg/m. If this is out of the aforementioned range listed, please consider follow up with your Primary Care Provider.  If you are age 18 or younger, your body mass index should be between 19-25. Your Body mass index is 22.34 kg/m. If this is out of the aformentioned range listed, please consider follow up with your Primary Care Provider.   ________________________________________________________  The [place] [facility] providers would like to encourage you to use [facility] to communicate with providers for non-urgent requests or questions.  Due to long hold times on the telephone, sending your provider a message by [facility] may be a faster and more efficient way to get a response.  Please allow 48 business hours for a response.  Please remember that this is for non-urgent requests.  _______________________________________________________

## 2022-10-30 NOTE — Progress Notes (Signed)
Agree with the assessment and plan as outlined by Alonza Bogus, PA-C.  Kameo Bains E. Candis Schatz, MD  [facility]

## 2022-12-05 DIAGNOSIS — Z86018 Personal history of other benign neoplasm: Secondary | ICD-10-CM | POA: Diagnosis not present

## 2022-12-05 DIAGNOSIS — L219 Seborrheic dermatitis, unspecified: Secondary | ICD-10-CM | POA: Diagnosis not present

## 2022-12-05 DIAGNOSIS — L578 Other skin changes due to chronic exposure to nonionizing radiation: Secondary | ICD-10-CM | POA: Diagnosis not present

## 2022-12-05 DIAGNOSIS — D225 Melanocytic nevi of trunk: Secondary | ICD-10-CM | POA: Diagnosis not present

## 2022-12-08 ENCOUNTER — Ambulatory Visit: Payer: BC Managed Care – PPO | Admitting: Gastroenterology

## 2022-12-12 ENCOUNTER — Encounter: Payer: Self-pay | Admitting: Gastroenterology

## 2022-12-12 ENCOUNTER — Ambulatory Visit (INDEPENDENT_AMBULATORY_CARE_PROVIDER_SITE_OTHER): Payer: BC Managed Care – PPO | Admitting: Gastroenterology

## 2022-12-12 VITALS — BP 102/68 | HR 90 | Ht 63.0 in | Wt 131.2 lb

## 2022-12-12 DIAGNOSIS — R0789 Other chest pain: Secondary | ICD-10-CM | POA: Diagnosis not present

## 2022-12-12 DIAGNOSIS — J029 Acute pharyngitis, unspecified: Secondary | ICD-10-CM

## 2022-12-12 DIAGNOSIS — R059 Cough, unspecified: Secondary | ICD-10-CM

## 2022-12-12 NOTE — Patient Instructions (Signed)
Follow up as needed.  Colonoscopy due at age 42.  _______________________________________________________  If your blood pressure at your visit was 140/90 or greater, please contact your primary care physician to follow up on this.  _______________________________________________________  If you are age 57 or older, your body mass index should be between 23-30. Your Body mass index is 23.25 kg/m. If this is out of the aforementioned range listed, please consider follow up with your Primary Care Provider.  If you are age 62 or younger, your body mass index should be between 19-25. Your Body mass index is 23.25 kg/m. If this is out of the aformentioned range listed, please consider follow up with your Primary Care Provider.   ________________________________________________________  The [place] [facility] providers would like to encourage you to use [facility] to communicate with providers for non-urgent requests or questions.  Due to long hold times on the telephone, sending your provider a message by [facility] may be a faster and more efficient way to get a response.  Please allow 48 business hours for a response.  Please remember that this is for non-urgent requests.  _______________________________________________________

## 2022-12-12 NOTE — Progress Notes (Signed)
12/12/2022 MAILEN NEWBORN [id number] Jul 12, 1981   HISTORY OF PRESENT ILLNESS: This is a 42 year old female who is here for follow-up of an episode of chest pain.  When I saw her on 10/27/2022 she described an episode of chest pain that lasted 6 hours.  She reported that she feels like she always has a sore throat and that she was often coughing or clearing her throat, but no overt heartburn or reflux symptoms.  We placed her on pantoprazole 40 mg daily.  She says that maybe the sore throat issue has improved, but she has not had any recurrent episodes of the chest pain.  Unsure if the medications are really helping or if it is all just coincidental.   Past Medical History:  Diagnosis Date   Carrier of genetic disorder    Disorder of breast    Past Surgical History:  Procedure Laterality Date   CESAREAN SECTION     x2   DILATION AND CURETTAGE OF UTERUS  09/25/2012   DILATION AND CURETTAGE OF UTERUS  12/24/2009    reports that she has never smoked. She has never used smokeless tobacco. She reports that she does not drink alcohol and does not use drugs. family history includes Breast cancer in her paternal aunt; CVA in her maternal grandmother; Colon polyps in her mother; Diabetes in her father; Emphysema in her paternal grandmother; Heart attack in her paternal grandfather; Hyperlipidemia in her father; Hypertension in her father; Stroke in her maternal grandfather. Allergies  Allergen Reactions   Paba Derivatives       Outpatient Encounter Medications as of 12/12/2022  Medication Sig   calcium carbonate (OS-CAL) 1250 (500 Ca) MG chewable tablet Chew 1 tablet by mouth daily.   Cholecalciferol 50 MCG (2000 UT) TABS 1 tablet Orally Once a day   cyanocobalamin (VITAMIN B12) 1000 MCG tablet Take 1,000 mcg by mouth daily.   Multiple Vitamin (MULTIVITAMIN) capsule Take 1 capsule by mouth daily.   pantoprazole (PROTONIX) 40 MG tablet Take 1 tablet (40 mg total) by mouth daily.   No  facility-administered encounter medications on file as of 12/12/2022.     REVIEW OF SYSTEMS  : All other systems reviewed and negative except where noted in the History of Present Illness.   PHYSICAL EXAM: BP 102/68   Pulse 90   Ht  (1.6 m)   Wt 131 lb 4 oz (59.5 kg)   BMI 23.25 kg/m  General: Well developed white female in no acute distress Head: Normocephalic and atraumatic Eyes:  Sclerae anicteric, conjunctiva pink. Ears: Normal auditory acuity Musculoskeletal: Symmetrical with no gross deformities  Neurological: Alert oriented x 4, grossly non-focal Psychological:  Alert and cooperative. Normal mood and affect  ASSESSMENT AND PLAN: *Atypical chest pain, cough, sore throat: ? Reflux related although no overt reflux symptoms.  We started her on pantoprazole 40 mg daily.  She feels like maybe the sore throat issue has cleared up some, but it is hard to know because of all the allergies recently, etc.  She only had the one episode of chest pain, and no recurrence of that.  Not really sure if any of this was reflux related.  She will consider taking the reflux medication a little bit longer and then come off of it and see if any symptoms recur.  Will hold off on EGD for now with no overt reflux symptoms, etc, and patient is not inclined to proceed either.  She will contact us for follow-up  as needed for any new, recurrent, worsening symptoms, etc.  *We discussed that she will need colonoscopy starting at age 44.   CC:  Jarrett Soho, PA-C

## 2022-12-15 DIAGNOSIS — Z1231 Encounter for screening mammogram for malignant neoplasm of breast: Secondary | ICD-10-CM | POA: Diagnosis not present

## 2022-12-18 NOTE — Progress Notes (Signed)
Agree with the assessment and plan as outlined by Jessica Zehr, PA-C.  Haifa Hatton E. Havish Petties, MD  [facility]  

## 2022-12-19 DIAGNOSIS — R1030 Lower abdominal pain, unspecified: Secondary | ICD-10-CM | POA: Diagnosis not present

## 2023-06-22 DIAGNOSIS — Z0000 Encounter for general adult medical examination without abnormal findings: Secondary | ICD-10-CM | POA: Diagnosis not present

## 2023-06-22 DIAGNOSIS — Z13220 Encounter for screening for lipoid disorders: Secondary | ICD-10-CM | POA: Diagnosis not present

## 2023-06-22 DIAGNOSIS — E538 Deficiency of other specified B group vitamins: Secondary | ICD-10-CM | POA: Diagnosis not present

## 2023-06-22 DIAGNOSIS — Z131 Encounter for screening for diabetes mellitus: Secondary | ICD-10-CM | POA: Diagnosis not present

## 2023-08-13 DIAGNOSIS — Z124 Encounter for screening for malignant neoplasm of cervix: Secondary | ICD-10-CM | POA: Diagnosis not present

## 2023-08-13 DIAGNOSIS — Z01419 Encounter for gynecological examination (general) (routine) without abnormal findings: Secondary | ICD-10-CM | POA: Diagnosis not present

## 2023-08-13 DIAGNOSIS — Z1331 Encounter for screening for depression: Secondary | ICD-10-CM | POA: Diagnosis not present

## 2023-08-17 DIAGNOSIS — N926 Irregular menstruation, unspecified: Secondary | ICD-10-CM | POA: Diagnosis not present

## 2023-10-09 DIAGNOSIS — Z1589 Genetic susceptibility to other disease: Secondary | ICD-10-CM | POA: Diagnosis not present

## 2023-12-25 DIAGNOSIS — L219 Seborrheic dermatitis, unspecified: Secondary | ICD-10-CM | POA: Diagnosis not present

## 2023-12-25 DIAGNOSIS — L578 Other skin changes due to chronic exposure to nonionizing radiation: Secondary | ICD-10-CM | POA: Diagnosis not present

## 2023-12-25 DIAGNOSIS — Z86018 Personal history of other benign neoplasm: Secondary | ICD-10-CM | POA: Diagnosis not present

## 2023-12-25 DIAGNOSIS — D225 Melanocytic nevi of trunk: Secondary | ICD-10-CM | POA: Diagnosis not present

## 2023-12-26 DIAGNOSIS — Z1231 Encounter for screening mammogram for malignant neoplasm of breast: Secondary | ICD-10-CM | POA: Diagnosis not present

## 2024-04-09 DIAGNOSIS — D224 Melanocytic nevi of scalp and neck: Secondary | ICD-10-CM | POA: Diagnosis not present

## 2024-04-09 DIAGNOSIS — D485 Neoplasm of uncertain behavior of skin: Secondary | ICD-10-CM | POA: Diagnosis not present

## 2024-06-26 DIAGNOSIS — Z0000 Encounter for general adult medical examination without abnormal findings: Secondary | ICD-10-CM | POA: Diagnosis not present

## 2024-07-15 DIAGNOSIS — Z1339 Encounter for screening examination for other mental health and behavioral disorders: Secondary | ICD-10-CM | POA: Diagnosis not present

## 2024-07-15 DIAGNOSIS — Z8632 Personal history of gestational diabetes: Secondary | ICD-10-CM | POA: Diagnosis not present

## 2024-07-15 DIAGNOSIS — E538 Deficiency of other specified B group vitamins: Secondary | ICD-10-CM | POA: Diagnosis not present

## 2024-07-15 DIAGNOSIS — Z0000 Encounter for general adult medical examination without abnormal findings: Secondary | ICD-10-CM | POA: Diagnosis not present

## 2024-07-15 DIAGNOSIS — Z1331 Encounter for screening for depression: Secondary | ICD-10-CM | POA: Diagnosis not present

## 2024-07-23 DIAGNOSIS — L989 Disorder of the skin and subcutaneous tissue, unspecified: Secondary | ICD-10-CM | POA: Diagnosis not present

## 2024-07-23 DIAGNOSIS — L03011 Cellulitis of right finger: Secondary | ICD-10-CM | POA: Diagnosis not present

## 2024-08-05 DIAGNOSIS — Z1211 Encounter for screening for malignant neoplasm of colon: Secondary | ICD-10-CM | POA: Diagnosis not present

## 2024-08-05 DIAGNOSIS — Z1589 Genetic susceptibility to other disease: Secondary | ICD-10-CM | POA: Diagnosis not present

## 2024-08-05 DIAGNOSIS — K219 Gastro-esophageal reflux disease without esophagitis: Secondary | ICD-10-CM | POA: Diagnosis not present

## 2024-08-27 DIAGNOSIS — Z1211 Encounter for screening for malignant neoplasm of colon: Secondary | ICD-10-CM | POA: Diagnosis not present

## 2024-08-27 DIAGNOSIS — K635 Polyp of colon: Secondary | ICD-10-CM | POA: Diagnosis not present
